# Patient Record
Sex: Female | Born: 1960 | Race: Black or African American | Hispanic: No | Marital: Single
Health system: Southern US, Community
[De-identification: ages and names within clinical notes are randomized; demographics above are authoritative.]

---

## 2013-05-11 ENCOUNTER — Observation Stay: Payer: Self-pay | Admitting: Internal Medicine

## 2013-05-11 LAB — CBC
HCT: 22.5 % — ABNORMAL LOW (ref 35.0–47.0)
HGB: 6.6 g/dL — ABNORMAL LOW (ref 12.0–16.0)
MCHC: 29.1 g/dL — ABNORMAL LOW (ref 32.0–36.0)
MCV: 59 fL — ABNORMAL LOW (ref 80–100)
Platelet: 303 10*3/uL (ref 150–440)
RBC: 3.84 10*6/uL (ref 3.80–5.20)

## 2013-05-11 LAB — BASIC METABOLIC PANEL
Anion Gap: 7 (ref 7–16)
BUN: 16 mg/dL (ref 7–18)
Creatinine: 1.27 mg/dL (ref 0.60–1.30)
EGFR (Non-African Amer.): 50 — ABNORMAL LOW
Osmolality: 278 (ref 275–301)

## 2013-05-11 LAB — URINALYSIS, COMPLETE
Blood: NEGATIVE
Glucose,UR: NEGATIVE mg/dL (ref 0–75)
Ketone: NEGATIVE
Leukocyte Esterase: NEGATIVE
Nitrite: NEGATIVE
Protein: 30
Specific Gravity: 1.018 (ref 1.003–1.030)
WBC UR: 3 /HPF (ref 0–5)

## 2013-05-11 LAB — TROPONIN I: Troponin-I: <0.02 ng/mL

## 2013-05-12 LAB — BASIC METABOLIC PANEL
Creatinine: 0.91 mg/dL (ref 0.60–1.30)
EGFR (Non-African Amer.): >60
Glucose: 95 mg/dL (ref 65–99)

## 2013-05-12 LAB — CBC WITH DIFFERENTIAL/PLATELET
HCT: 29 % — ABNORMAL LOW (ref 35.0–47.0)
Lymphocytes: 20 %
MCHC: 32.4 g/dL (ref 32.0–36.0)
Monocytes: 10 %
RBC: 4.41 10*6/uL (ref 3.80–5.20)
RDW: 30 % — ABNORMAL HIGH (ref 11.5–14.5)

## 2013-06-19 ENCOUNTER — Ambulatory Visit: Payer: Self-pay | Admitting: Obstetrics and Gynecology

## 2013-06-19 LAB — BASIC METABOLIC PANEL
Anion Gap: 2 — ABNORMAL LOW (ref 7–16)
BUN: 11 mg/dL (ref 7–18)
CALCIUM: 9.3 mg/dL (ref 8.5–10.1)
CREATININE: 0.85 mg/dL (ref 0.60–1.30)
Chloride: 103 mmol/L (ref 98–107)
Co2: 30 mmol/L (ref 21–32)
EGFR (African American): >60
EGFR (Non-African Amer.): >60
Glucose: 89 mg/dL (ref 65–99)
Osmolality: 269 (ref 275–301)
Potassium: 3.8 mmol/L (ref 3.5–5.1)
SODIUM: 135 mmol/L — AB (ref 136–145)

## 2013-06-19 LAB — CBC
HCT: 34.5 % — ABNORMAL LOW (ref 35.0–47.0)
HGB: 11.1 g/dL — ABNORMAL LOW (ref 12.0–16.0)
MCH: 22.3 pg — ABNORMAL LOW (ref 26.0–34.0)
MCHC: 32 g/dL (ref 32.0–36.0)
MCV: 70 fL — ABNORMAL LOW (ref 80–100)
PLATELETS: 274 10*3/uL (ref 150–440)
RBC: 4.95 10*6/uL (ref 3.80–5.20)
RDW: 31.9 % — ABNORMAL HIGH (ref 11.5–14.5)
WBC: 6.3 10*3/uL (ref 3.6–11.0)

## 2013-06-27 ENCOUNTER — Ambulatory Visit: Payer: Self-pay | Admitting: Obstetrics and Gynecology

## 2013-06-27 LAB — BASIC METABOLIC PANEL
ANION GAP: 3 — AB (ref 7–16)
BUN: 10 mg/dL (ref 7–18)
CALCIUM: 8.8 mg/dL (ref 8.5–10.1)
CHLORIDE: 104 mmol/L (ref 98–107)
CREATININE: 0.91 mg/dL (ref 0.60–1.30)
Co2: 29 mmol/L (ref 21–32)
Glucose: 126 mg/dL — ABNORMAL HIGH (ref 65–99)
Osmolality: 273 (ref 275–301)
POTASSIUM: 3.8 mmol/L (ref 3.5–5.1)
SODIUM: 136 mmol/L (ref 136–145)

## 2013-06-28 LAB — BASIC METABOLIC PANEL
Anion Gap: 4 — ABNORMAL LOW (ref 7–16)
BUN: 16 mg/dL (ref 7–18)
CALCIUM: 9.5 mg/dL (ref 8.5–10.1)
CREATININE: 0.99 mg/dL (ref 0.60–1.30)
Chloride: 103 mmol/L (ref 98–107)
Co2: 30 mmol/L (ref 21–32)
EGFR (Non-African Amer.): >60
Glucose: 96 mg/dL (ref 65–99)
Osmolality: 275 (ref 275–301)
POTASSIUM: 3.5 mmol/L (ref 3.5–5.1)
Sodium: 137 mmol/L (ref 136–145)

## 2013-07-02 LAB — PATHOLOGY REPORT

## 2014-07-25 IMAGING — US US PELV - US TRANSVAGINAL
1 series · 14 of 25 positions shown · non-contrast
Comparison: None

CLINICAL DATA: Metrorrhagia, anemia.



[Series 1: us pelv - us transvaginal · 0.22mm/px · 14 of 98 slices shown]
[im 1/98]
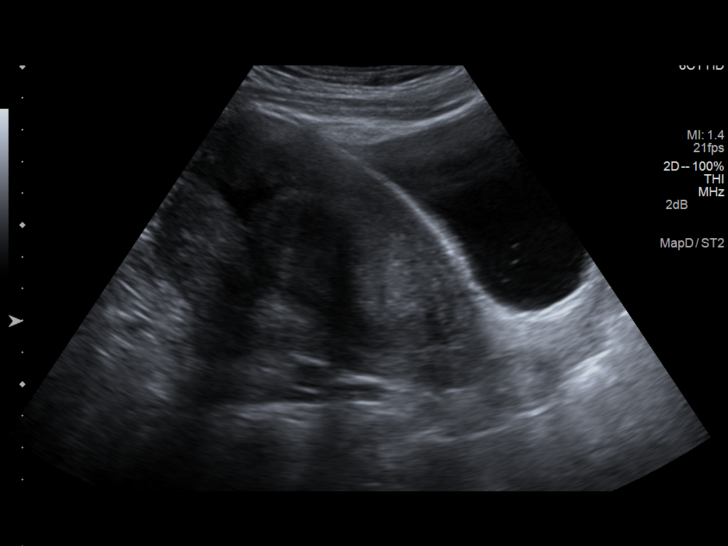
[im 9/98]
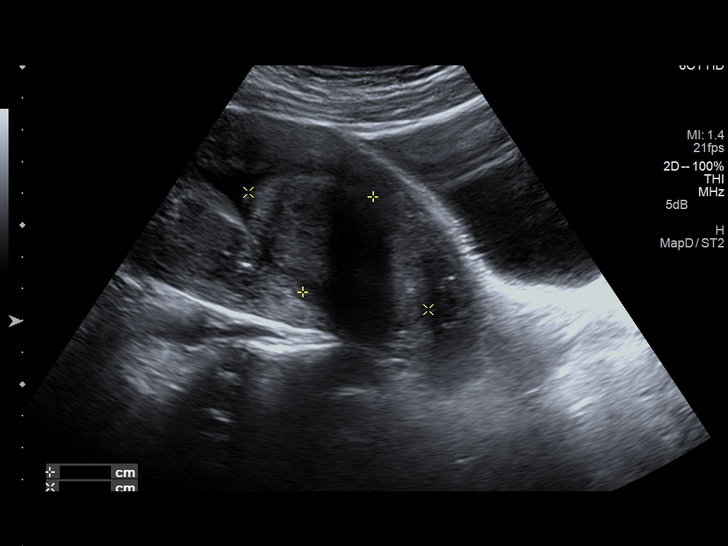
[im 17/98]
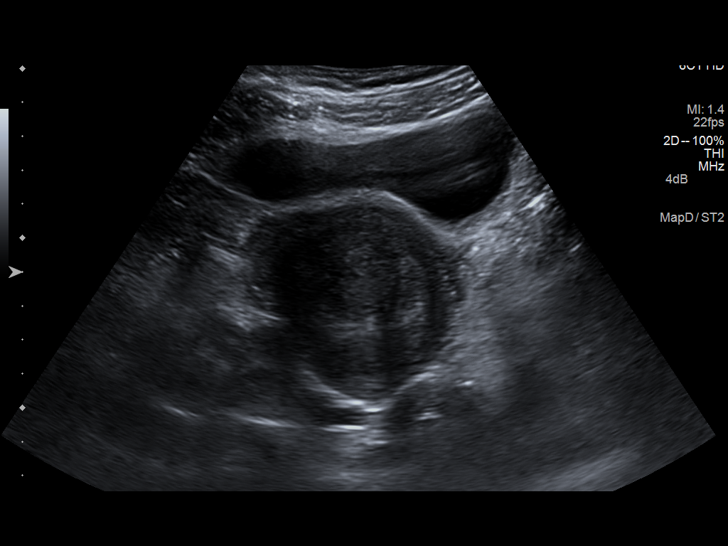
[im 25/98]
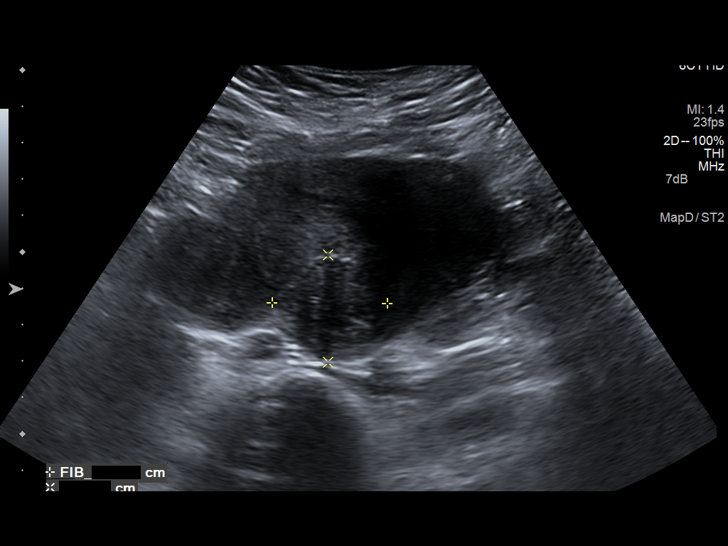
[im 33/98]
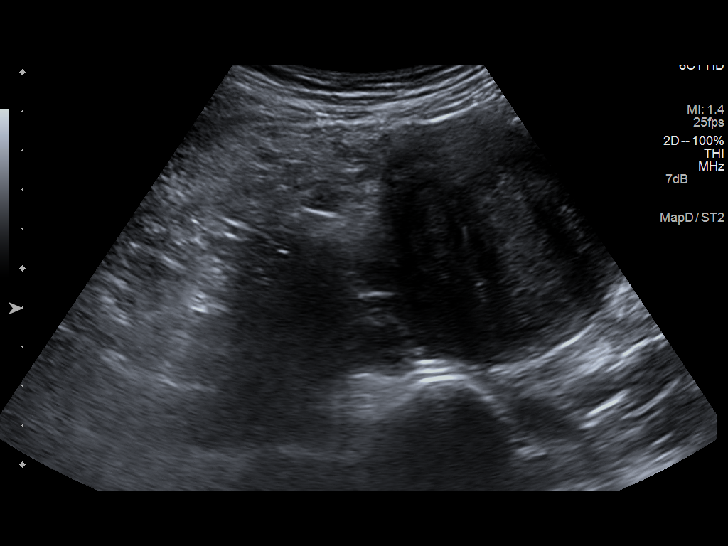
[im 37/98]
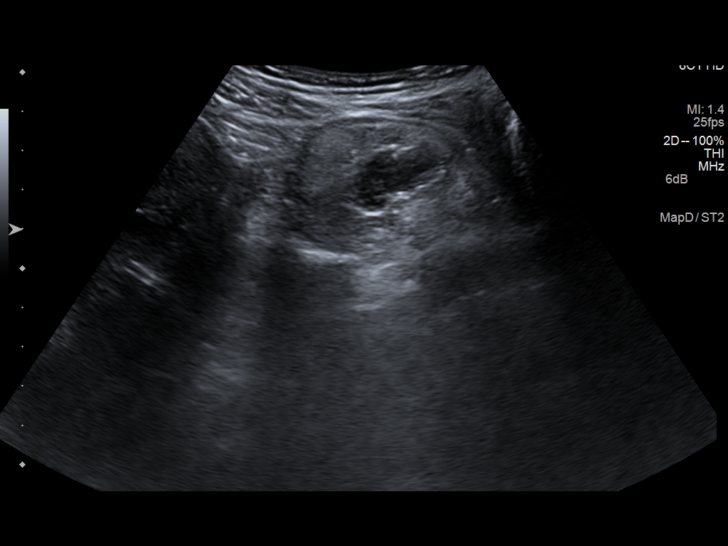
[im 45/98]
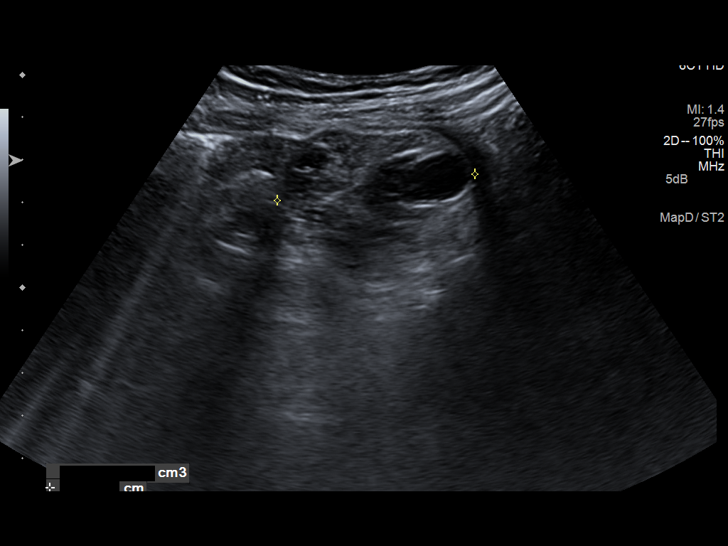
[im 53/98]
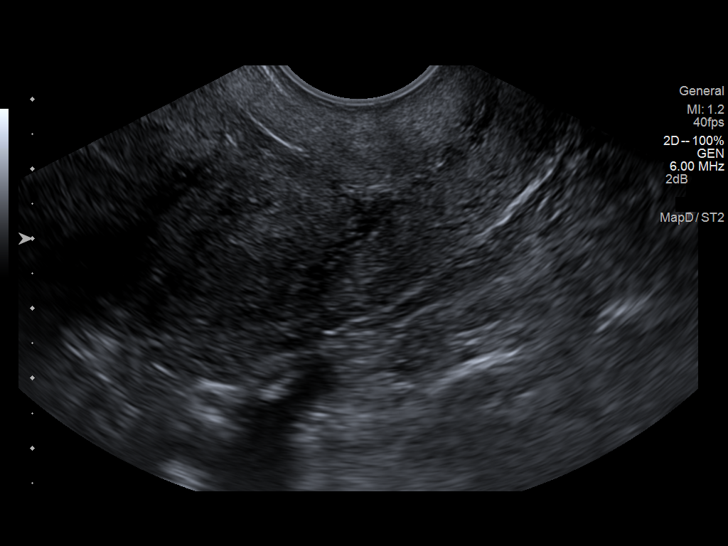
[im 61/98]
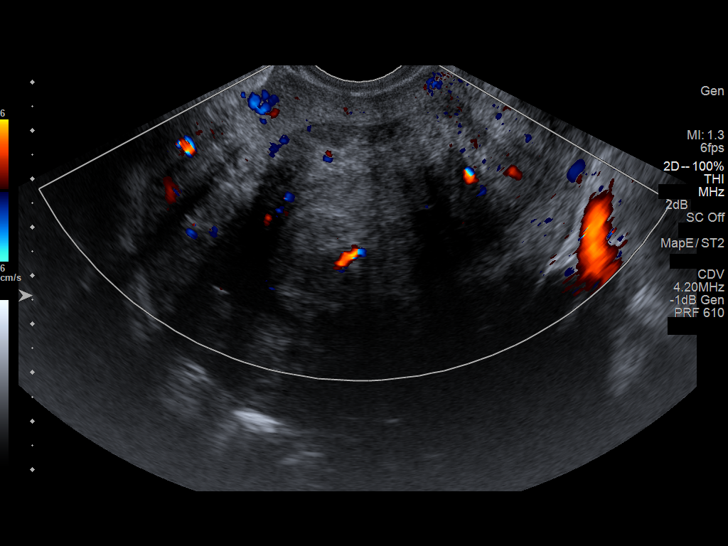
[im 65/98]
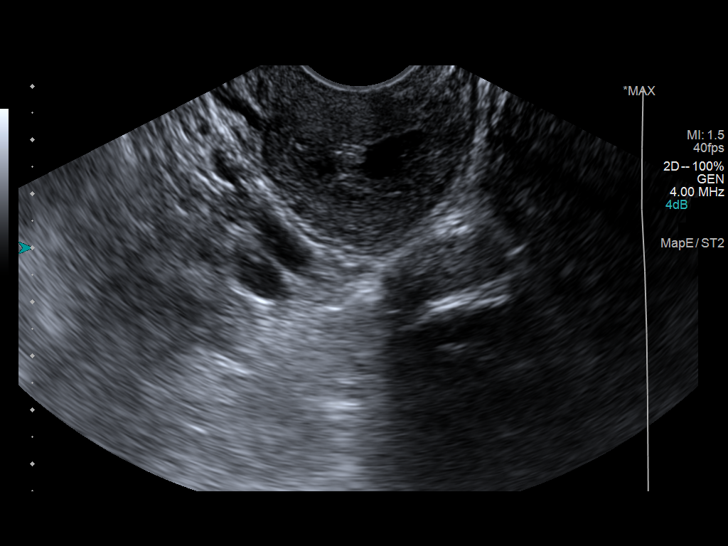
[im 73/98]
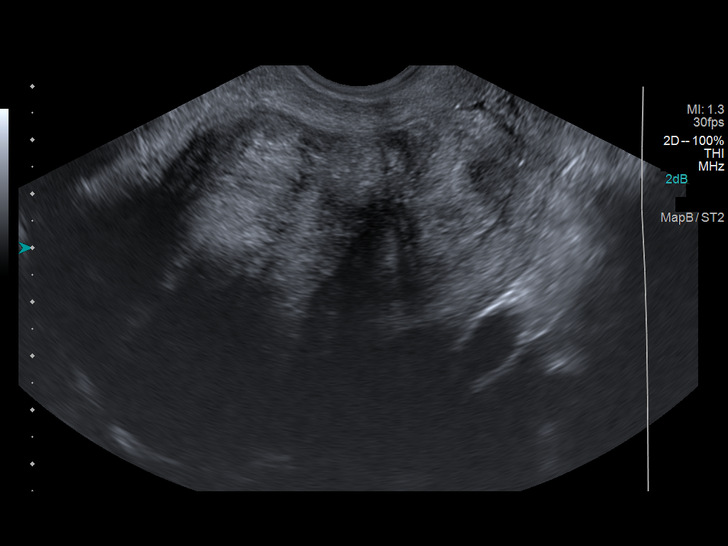
[im 81/98]
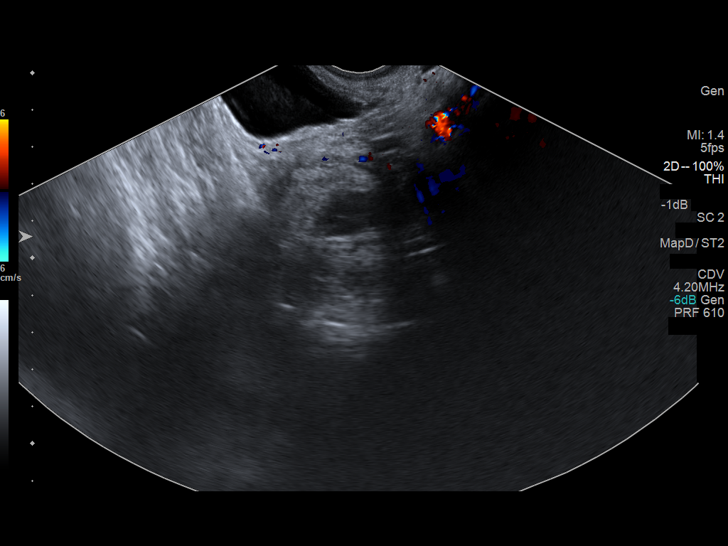
[im 89/98]
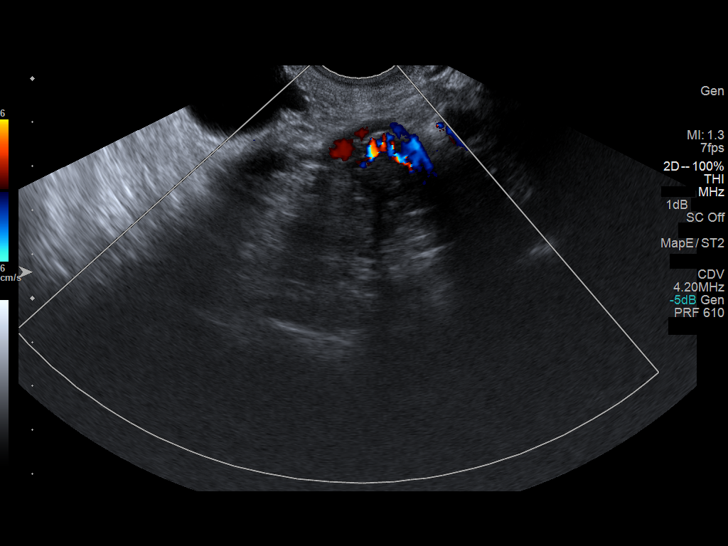
[im 98/98]
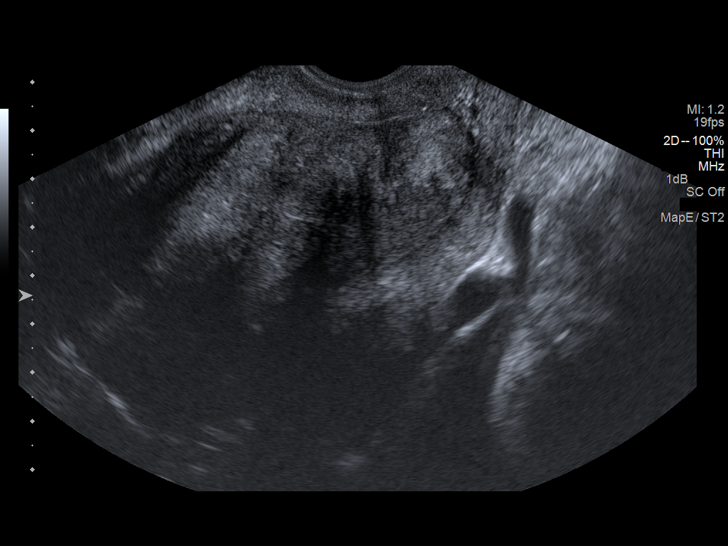

[14 of 25 positions shown; findings below may reference images not displayed]

FINDINGS: Uterus

Measurements: 13.7 x 7.1 x 6.2 cm.. There several fibroids. 3 x
x 1.9 cm in the posterior body, 39 x 24 x 27 mm posterolateral right
body.

Endometrium

Thickness: 33 mm. There is a an endometrial mass measuring 37 x 68 x
46 mm with a similar echotexture, demonstrating internal flow
signal, and a small amount of adjacent fluid in the endometrial
cavity.

Right ovary

Measurements: Surgically absent.

Left ovary

Measurements: 51 x 39 x 47 mm. Contains a 23 x 12 x 17 mm complex
cyst.

Other findings

No free fluid.
IMPRESSION: 1. 6.8 cm endometrial mass. Further evaluation recommended to
differentiate polyp from neoplasm.
2. Uterine fibroids.

## 2014-10-03 NOTE — Discharge Summary (Signed)
PATIENT NAME:  Rhonda GoadBAKER, Jadynn MR#:  [id number] DATE OF BIRTH:  1960/11/12  DATE OF ADMISSION:  05/11/2013  DATE OF DISCHARGE:  05/12/2013  The patient's primary care physician will be Gynecology, Dr. Bonney AidStaebler.  FINAL DIAGNOSES: 1.  Symptomatic anemia, with fainting.  2.  Uterine mass versus polyp, with fibroids.  3.  History of ulcerative colitis.  MEDICATIONS ON DISCHARGE: She does have an iron tablet that she will take 3 times a day.  FOLLOW UP:  With Dr. Bonney AidStaebler, Gynecology, at 2:30 p.m. on Tuesday, [address] address.  REASON FOR ADMISSION:  The patient was admitted as an observation on 05/11/2013. Came in with a fainting episode.  Found to be anemic, with a hemoglobin of 6.6. The patient was given transfusion for symptomatic anemia.   Laboratory and radiological data during the hospital course included a troponin that was negative. White blood cell count 12.1, H and H 6.6 and 22.5, platelet count of 303. Glucose 113, BUN 16, creatinine 1.27, sodium 138, potassium 3.6, chloride 106, CO2 of 25, calcium 8.6. Urine pregnancy test negative. Urinalysis: 30 mg/dL of protein. Hemoglobin after 1 unit of blood 7.2, hemoglobin after a second unit of blood 9.4. White blood cell count upon discharge 8.5. Pelvic ultrasound showed a 6.8 cm endometrial mass. Further evaluation recommended to differentiate polyp from neoplasm, and uterine fibroids seen.   HOSPITAL COURSE PER PROBLEM LIST:  1.  For the patient's symptomatic anemia with fainting episode, the patient was transfused 2 units of packed red blood cells. The patient's hemoglobin did come up into a reasonable range. The patient states that she has her iron at home that she will take 3 times a day. I also advised her not to eat ice, which can also cause a symptomatic anemia, but I believe her anemia is secondary to the uterine mass and fibroids.  2.  Uterine mass versus polyp, and uterine fibroids. I did speak with Dr. Bonney AidStaebler, Gynecology, who  will follow up the patient as outpatient, set up an appointment for Tuesday for further workup as outpatient. Likely will end up needing a biopsy of this uterine lesion, and that can be done as outpatient.   3. History of ulcerative colitis. The patient is not on any medications for this.   Time spent on discharge:  40 minutes.     ____________________________ Herschell Dimesichard J. Renae GlossWieting, MD rjw:mr D: 05/12/2013 14:28:15 ET T: 05/12/2013 20:22:16 ET JOB#: [id number]  cc: Herschell Dimesichard J. Renae GlossWieting, MD, <Dictator> ScenicAndreas M. Bonney AidStaebler, MD  Salley ScarletICHARD J Alvera Tourigny MD ELECTRONICALLY SIGNED 05/17/2013 16:01

## 2014-10-03 NOTE — H&P (Signed)
PATIENT NAME:  Rhonda Morton, CLEGG MR#:  [id number] DATE OF BIRTH:  08/01/1960  DATE OF ADMISSION:  05/11/2013  CHIEF COMPLAINT: Dizziness, lightheadedness, and fainting episode.   REFERRING PHYSICIAN: Dr. Scotty Court.  PRIMARY CARE PHYSICIAN: None.   HISTORY OF PRESENT ILLNESS: This is a very nice 54 year old female with a history of ulcerative colitis in remission for more than 5 years, fibroids, and iron deficiency anemia, comes today with a history of going back to work being her normal self. Pretty much this last week, everything has been normal. She works out, goes to [employer], walks around without problems up until today when she was working around 1:00 p.m., she started getting dizzy, lightheaded, and felt really hot. She knew something was not right and whenever she was trying to go and sit down, she actually fainted and slowly fell down onto the floor. She says that she did have any significant trauma. She was able to put down her arms and put her body in positions where she did not hit her head or any other injuries that she could sustain. She was brought to the Emergency Department where her hemoglobin was 6.6, for what a transfusion of blood was ordered.   The patient has a history of previous problems happening due to fibroids and vaginal bleeding. Her last menstrual period was about October 30. She states that in October, she had 2 very heavy vaginal bleedings on the 13th and the 30th, and she has been spotting some blood for the past 3 to 4 days. The patient has been told that she needs a hysterectomy, but she has not been able to follow up because she does not have a primary care physician, GYN or an active insurance.   The patient is admitted for transfusion, and at this moment, she is hemodynamically stable.  REVIEW OF SYSTEMS: A 12-system review of systems is done.  CONSTITUTIONAL: No fever, fatigue. Positive lightheadedness today. No significant weakness prior to today. No weight loss or  weight gain.  EYES: No double vision, blurry vision.  EARS, NOSE, THROAT: No tinnitus. No difficulty swallowing.  RESPIRATORY: No cough, shortness of breath, or wheezing.  CARDIOVASCULAR: No chest pain, orthopnea, edema. No palpitations.  GASTROINTESTINAL: No nausea, vomiting, abdominal pain, constipation, or diarrhea. She was previously diagnosed with ulcerative colitis. The patient states that she has been in remission, not even taking medications.  GENITOURINARY: No dysuria, hematuria, changes in frequency.  ENDOCRINE: No polyuria, polydipsia, polyphagia, cold or heat intolerance.  MUSCULOSKELETAL: No significant joint pains or gout. No significant swollen joints. HEMATOLOGIC AND LYMPHATIC: No easy bruising. The patient has history of anemia secondary to iron deficiency and metrorrhagia. No swollen glands.  SKIN: No rashes, petechiae, or new lesions.  NEUROLOGIC: No numbness, tingling, or CVAs.  PSYCHIATRIC: No insomnia or depression.   PAST MEDICAL HISTORY: 1.  Ulcerative cellulitis in remission.  2.  Uterine fibroids.  3.  Metrorrhagia.  4.  Iron deficiency anemia.   ALLERGIES: No known drug allergies.   PAST SURGICAL HISTORY: Myomectomy in 2006.   FAMILY HISTORY: Positive for CHF in her mother who died from complications of it, coronary artery disease in her dad who had a heart attack. No history of cancer in the family. Her brother had a CVA just 2 days ago.   SOCIAL HISTORY: No tobacco. EtOH very seldom. No drugs. The patient works at places like [employer] or [employer] with [employer].   MEDICATIONS: The patient takes over-the-counter iron. The patient states that she  cannot take ferrous sulfate because it is hard on her stomach, but she can take over formulations without a problem.   PHYSICAL EXAMINATION: VITAL SIGNS: Her blood pressure is 140/68, pulse 74, respirations 20. Temperature has not been recorded. We are going to get one now. Oxygen saturation 100% on room air.   GENERAL: The patient is alert, oriented x 3, in no acute distress. No respiratory distress. Hemodynamically is stable.  HEENT: Pupils are equal and reactive. Extraocular movements are intact. Mucosae are moist. Pale conjunctivae and mucosae. No oral lesions. No oropharyngeal exudates.  NECK: Supple. No JVD. No thyromegaly. No adenopathy. No carotid bruits. No rigidity.  CARDIOVASCULAR: Regular rate and rhythm. No murmurs, rubs, or gallops are appreciated. No displacement of PMI.  LUNGS: Clear without any wheezing or crepitus. No use of accessory muscles.  ABDOMEN: Soft, nontender, nondistended. No hepatosplenomegaly. No masses. VAGINAL: Exam deferred.  RECTAL: Exam is deferred.  EXTREMITIES: No edema, cyanosis, or clubbing.  VASCULAR: Pulses +2. Capillary refill less than 3.  SKIN: No rashes, petechiae.  LYMPHATIC: Negative for lymphadenopathy. There is no hepatosplenomegaly.  NEUROLOGIC: Cranial nerves II through XII intact. Strength is 5/5 in all 4 extremities.  PSYCHIATRIC: No anxiety or depression. The patient is alert, oriented x 3.  MUSCULOSKELETAL: No joint effusions or deformity.   LABORATORY DATA: Glucose 113, creatinine 1.27. White count is 12,000, hemoglobin 6.6,  platelet count 303 (microcytic and hypochromic anemia). White count in urine 3, red count in urine 1.   ASSESSMENT AND PLAN: This is a very nice 54 year old female with history of iron deficiency anemia secondary to menometrorrhagia. The patient has fibroids on her uterus. Previous history of ulcerative colitis but in remission over 5 years.  1.  Acute symptomatic anemia: The patient had a fainting episode where she fell on the floor. At this moment, she is not tachycardic. Her blood pressure is okay. We are going to get orthostatics. Since her hemoglobin was 6.6, we are going to give her a transfusion of blood, already ordered by the Emergency Department doctor. We are going to order an ultrasound of the pelvis to evaluate  for new fibroids or new lesions, to evaluate the size of her endometrium as well. Consider other etiologies of this anemia, for what we are going to get a guaiac level. She has clear iron deficiency anemia. I do not think it is necessary to recheck iron levels at this moment, but we are going to give her a prescription of Niferex, which should be very gentle on her stomach. We are going to recommend her to take iron 3 times daily. The patient does not have a primary care physician. She does not have a GYN. We recommended [facility], or if there is a possibility to get her hooked up to a local GYN, we can do that. We are [facility].  2.  As far as her borderline hypertension, just continue to monitor. No need for treatment at this moment.  3.  Gastrointestinal prophylaxis with Protonix. 4.  Deep vein thrombosis prophylaxis with mechanical compression devices. As the patient has significant anemia, we are not going to give her any chemical prophylaxis.   TIME SPENT: I spent about 45 minutes with this patient.    ____________________________ Felipa Furnaceoberto Sanchez Gutierrez, MD rsg:jcm D: 05/11/2013 18:57:49 ET T: 05/11/2013 19:23:54 ET JOB#: [id number]  cc: Felipa Furnaceoberto Sanchez Gutierrez, MD, <Dictator> Shantee Hayne Juanda ChanceSANCHEZ GUTIERRE MD ELECTRONICALLY SIGNED  05/23/2013 18:00 

## 2014-10-04 NOTE — Op Note (Signed)
PATIENT NAME:  Rhonda GoadBAKER, Dru MR#:  [id number] DATE OF BIRTH:  [birth year]  DATE OF PROCEDURE:  06/27/2013  PREOPERATIVE DIAGNOSIS:  Menorrhagia and intracavitary myoma.  POSTOPERATIVE DIAGNOSIS:  Menorrhagia and intracavitary myoma.   OPERATION PERFORMED:  Hysteroscopy with partial resection of intracavitary myoma.   ANESTHESIA USED:  General.   PRIMARY SURGEON:  Florina OuAndreas M. Bonney AidStaebler, M.D.  PREOPERATIVE ANTIBIOTICS:  None.   DRAINS OR TUBES:  None.   ESTIMATED BLOOD LOSS:  50 mL.   OPERATIVE FLUIDS:  1500 mL of crystalloid, 50 mL of urine output straight cath at the beginning of the case, normal saline deficit of 1 liter.   INTRAOPERATIVE FINDINGS:  A 10 week size uterus.  No descensus, likely secondary from scarring from her prior myomectomies.  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  The myoma had a small stalk taking off from the right aspect of the uterus, the myoma given its size as well as some cervical dilation that was already present prior to starting the case which resulted in difficulty in maintaining adequate distention and resulted in a fluid deficit and was therefore only partially resected.  There was a small cervical laceration from a single-tooth tenaculum which was repaired with a figure-of-eight suture and Monsel solution was applied.   SPECIMENS REMOVED:  Morcellated portion of myoma.   CONDITION FOLLOWING PROCEDURE:  Stable.   PROCEDURE IN DETAIL:  Risks, benefits and alternatives of the procedure were discussed with the patient prior to proceeding to the Operating Room.  The patient was taken to the Operating Room where she was placed under general endotracheal anesthesia.  She was positioned in the dorsal lithotomy position, prepped and draped in the usual sterile fashion.  Attention was turned to the patient's pelvis after a timeout had been performed.  The patient's bladder was straight-cathed with a red rubber catheter.  An  operative speculum was placed.  The anterior lip of the cervix was grasped with a single-tooth tenaculum with minimal descensus of the cervix and uterus noted.  The cervix was already fairly dilated and did not require dilation to place the hysteroscope.  Upon placing the hysteroscope into the uterine cavity the entirety of the cavity was filled with a large submucosal myoma.  Navigating around the cavity, both tubal ostia were able to be visualized.  The stock of the myoma originated on the right lateral aspect of the uterus approximately midway to the fundus.  The uterine stalk was injected with dilute vasopressin following injection of the stalk resection with the MyoSure was begun.  The resection had to be intermittently stopped secondary to a lot of fluid escaping through the patient's cervix.  A Dolphin system was used to maintain track of the deficit.  Total of 3 MyoSures were used, but broke during the course of the resection.  It is decided given the fluid deficit as well as the size of the myoma to abort the case mid-way and favor a two-stage procedure versus proceeding with a hysterectomy after discussion with the patient.  The hysteroscope was removed.  The cervix was noted to be hemostatic and removal of the single-tooth tenaculum, there was a laceration on the cervix which was closed with a figure-of-eight of 0 Vicryl.  Monsel solution was applied.  Sponge, needle and instrument counts were correct x 2.  The patient tolerated the procedure well and was taken to the recovery room in stable condition.     ____________________________ Carmel SacramentoAndreas  Cathrine Muster, MD ams:ea D: 06/27/2013 20:14:41 ET T: 06/27/2013 23:24:54 ET JOB#: [id number]  cc: Florina Ou. Bonney Aid, MD, <Dictator> Carmel Sacramento Cathrine Muster MD ELECTRONICALLY SIGNED 07/04/2013 8:55
